# Patient Record
Sex: Female | Born: 1960 | Race: Black or African American | Hispanic: No | Marital: Single | Smoking: Never smoker
Health system: Southern US, Community
[De-identification: ages and names within clinical notes are randomized; demographics above are authoritative.]

## PROBLEM LIST (undated history)

## (undated) HISTORY — PX: ABDOMINAL HYSTERECTOMY: SHX81

---

## 2001-05-22 ENCOUNTER — Other Ambulatory Visit: Admission: RE | Admit: 2001-05-22 | Discharge: 2001-05-22 | Payer: Self-pay | Admitting: Family Medicine

## 2004-02-09 ENCOUNTER — Emergency Department (HOSPITAL_COMMUNITY): Admission: EM | Admit: 2004-02-09 | Discharge: 2004-02-09 | Payer: Self-pay | Admitting: Emergency Medicine

## 2005-01-25 ENCOUNTER — Encounter: Admission: RE | Admit: 2005-01-25 | Discharge: 2005-01-25 | Payer: Self-pay | Admitting: Internal Medicine

## 2006-02-16 ENCOUNTER — Other Ambulatory Visit: Admission: RE | Admit: 2006-02-16 | Discharge: 2006-02-16 | Payer: Self-pay | Admitting: Obstetrics & Gynecology

## 2006-11-24 ENCOUNTER — Encounter: Admission: RE | Admit: 2006-11-24 | Discharge: 2006-11-24 | Payer: Self-pay | Admitting: Obstetrics & Gynecology

## 2007-12-05 ENCOUNTER — Encounter: Admission: RE | Admit: 2007-12-05 | Discharge: 2007-12-05 | Payer: Self-pay | Admitting: Obstetrics & Gynecology

## 2009-08-31 ENCOUNTER — Encounter (INDEPENDENT_AMBULATORY_CARE_PROVIDER_SITE_OTHER): Payer: Self-pay | Admitting: Obstetrics & Gynecology

## 2009-08-31 ENCOUNTER — Inpatient Hospital Stay (HOSPITAL_COMMUNITY): Admission: RE | Admit: 2009-08-31 | Discharge: 2009-09-02 | Payer: Self-pay | Admitting: Obstetrics & Gynecology

## 2010-10-13 ENCOUNTER — Encounter: Admission: RE | Admit: 2010-10-13 | Discharge: 2010-10-13 | Payer: Self-pay | Admitting: Obstetrics & Gynecology

## 2011-03-18 LAB — CBC
HCT: 32.5 % — ABNORMAL LOW (ref 36.0–46.0)
HCT: 37.2 % (ref 36.0–46.0)
MCHC: 34.4 g/dL (ref 30.0–36.0)
MCHC: 34.5 g/dL (ref 30.0–36.0)
MCV: 95.7 fL (ref 78.0–100.0)
MCV: 96 fL (ref 78.0–100.0)
Platelets: 293 10*3/uL (ref 150–400)
Platelets: 330 10*3/uL (ref 150–400)
RDW: 12.9 % (ref 11.5–15.5)
WBC: 9.1 10*3/uL (ref 4.0–10.5)

## 2011-03-18 LAB — PROTIME-INR: Prothrombin Time: 13.3 seconds (ref 11.6–15.2)

## 2015-03-11 ENCOUNTER — Other Ambulatory Visit: Payer: Self-pay | Admitting: General Surgery

## 2015-04-10 ENCOUNTER — Ambulatory Visit (HOSPITAL_COMMUNITY)
Admission: RE | Admit: 2015-04-10 | Discharge: 2015-04-10 | Disposition: A | Discharge status: Home / Self Care | Payer: 59 | Source: Ambulatory Visit | Attending: General Surgery | Admitting: General Surgery

## 2015-04-10 ENCOUNTER — Encounter (HOSPITAL_COMMUNITY)
Admission: RE | Disposition: A | Discharge status: Home / Self Care | Payer: Self-pay | Source: Ambulatory Visit | Attending: General Surgery

## 2015-04-10 ENCOUNTER — Other Ambulatory Visit: Payer: 59

## 2015-04-10 HISTORY — PX: BREATH TEK H PYLORI: SHX5422

## 2015-04-10 SURGERY — BREATH TEST, FOR HELICOBACTER PYLORI

## 2015-04-10 NOTE — Progress Notes (Signed)
   04/10/15 1018  BREATH TEK ASSESSMENT  Referring MD Dr Gaynelle AduEric Wilson  Time of Last PO Intake 1900  Baseline Breath At: 0748  Pranactin Given At: 0751  Post-Dose Breath At: 0806  Sample 1 2.9%  Sample 2 1.9%  Test Postive

## 2015-04-13 ENCOUNTER — Encounter (HOSPITAL_COMMUNITY): Payer: Self-pay | Admitting: General Surgery

## 2015-04-15 ENCOUNTER — Encounter: Payer: Self-pay | Admitting: Dietician

## 2015-04-15 ENCOUNTER — Encounter: Payer: 59 | Attending: General Surgery | Admitting: Dietician

## 2015-04-15 DIAGNOSIS — Z713 Dietary counseling and surveillance: Secondary | ICD-10-CM | POA: Insufficient documentation

## 2015-04-15 DIAGNOSIS — Z6841 Body Mass Index (BMI) 40.0 and over, adult: Secondary | ICD-10-CM | POA: Insufficient documentation

## 2015-04-15 NOTE — Progress Notes (Signed)
  Pre-Op Assessment Visit:  Pre-Operative Sleeve Gastrectomy Surgery  Medical Nutrition Therapy:  Appt start time: 1400   End time:  1445.  Patient was seen on 04/15/2015 for Pre-Operative Nutrition Assessment. Assessment and [facility] coordinator on 04/15/2015.   Preferred Learning Style:   No preference indicated   Learning Readiness:   Ready  Handouts given during visit include:  Pre-Op Goals Bariatric Surgery Protein Shakes   During the appointment today the following Pre-Op Goals were reviewed with the patient: Maintain or lose weight as instructed by your surgeon Make healthy food choices Begin to limit portion sizes Limited concentrated sugars and fried foods Keep fat/sugar in the single digits per serving on   food labels Practice CHEWING your food  (aim for 30 chews per bite or until applesauce consistency) Practice not drinking 15 minutes before, during, and 30 minutes after each meal/snack Avoid all carbonated beverages  Avoid/limit caffeinated beverages  Avoid all sugar-sweetened beverages Consume 3 meals per day; eat every 3-5 hours Make a list of non-food related activities Aim for 64-100 ounces of FLUID daily  Aim for at least 60-80 grams of PROTEIN daily Look for a liquid protein source that contain ?15 g protein and ?5 g carbohydrate  (ex: shakes, drinks, shots)  Patient-Centered Goals: Jasmine Tate would like to improve her life, avoid discrimination when applying to a new job, and climb a mountain in [place].  10 level of confidence/10 level of importance  Demonstrated degree of understanding via:  Teach Back  Teaching Method Utilized:  Visual Auditory Hands on  Barriers to learning/adherence to lifestyle change: none  Patient to call the [facility] to enroll in Pre-Op and Post-Op Nutrition Education when surgery date is scheduled.

## 2015-04-15 NOTE — Patient Instructions (Addendum)
Follow Pre-Op Goals Try Protein Shakes Contact [place] to make sure you have met your supervised weight loss goal: [phone] Call [facility] at [phone] when surgery is scheduled to enroll in Pre-Op Class  Things to remember:  Please always be honest with [place]. We want to support you!  If you have any questions or concerns in between appointments, please call or email Ferol Luz, or Margarita Grizzle.  The diet after surgery will be high protein and low in carbohydrate.  Vitamins and calcium need to be taken for the rest of your life.  Feel free to include support people in any classes or appointments.

## 2015-04-17 ENCOUNTER — Ambulatory Visit: Payer: Self-pay | Admitting: Psychology

## 2015-05-21 ENCOUNTER — Encounter (HOSPITAL_COMMUNITY): Admission: RE | Payer: Self-pay | Source: Ambulatory Visit

## 2015-05-21 ENCOUNTER — Ambulatory Visit (HOSPITAL_COMMUNITY): Admission: RE | Admit: 2015-05-21 | Payer: 59 | Source: Ambulatory Visit | Admitting: General Surgery

## 2015-05-21 SURGERY — BREATH TEST, FOR HELICOBACTER PYLORI

## 2015-08-27 ENCOUNTER — Encounter: Payer: Self-pay | Admitting: *Deleted

## 2015-08-28 ENCOUNTER — Encounter
Admission: RE | Disposition: A | Discharge status: Home / Self Care | Payer: Self-pay | Source: Ambulatory Visit | Attending: Gastroenterology

## 2015-08-28 ENCOUNTER — Ambulatory Visit: Payer: 59 | Admitting: Anesthesiology

## 2015-08-28 ENCOUNTER — Encounter: Payer: Self-pay | Admitting: *Deleted

## 2015-08-28 ENCOUNTER — Ambulatory Visit
Admission: RE | Admit: 2015-08-28 | Discharge: 2015-08-28 | Disposition: A | Discharge status: Home / Self Care | Payer: 59 | Source: Ambulatory Visit | Attending: Gastroenterology | Admitting: Gastroenterology

## 2015-08-28 DIAGNOSIS — Z79899 Other long term (current) drug therapy: Secondary | ICD-10-CM | POA: Diagnosis not present

## 2015-08-28 DIAGNOSIS — Z6841 Body Mass Index (BMI) 40.0 and over, adult: Secondary | ICD-10-CM | POA: Diagnosis not present

## 2015-08-28 DIAGNOSIS — Z1211 Encounter for screening for malignant neoplasm of colon: Secondary | ICD-10-CM | POA: Diagnosis present

## 2015-08-28 HISTORY — DX: Morbid (severe) obesity due to excess calories: E66.01

## 2015-08-28 HISTORY — PX: COLONOSCOPY: SHX5424

## 2015-08-28 SURGERY — COLONOSCOPY
Anesthesia: General

## 2015-08-28 MED ORDER — PROPOFOL 10 MG/ML IV BOLUS
INTRAVENOUS | Status: DC | PRN
  Administered 2015-08-28: 120 mg via INTRAVENOUS

## 2015-08-28 MED ORDER — SODIUM CHLORIDE 0.9 % IV SOLN
INTRAVENOUS | Status: DC
Start: 2015-08-28 — End: 2015-08-28

## 2015-08-28 MED ORDER — SODIUM CHLORIDE 0.9 % IV SOLN: INTRAVENOUS | Status: DC

## 2015-08-28 MED ORDER — LIDOCAINE HCL (CARDIAC) 10 MG/ML IV SOLN
INTRAVENOUS | Status: DC | PRN
Start: 2015-08-28 — End: 2015-08-28
  Administered 2015-08-28: 1 mL via INTRAVENOUS

## 2015-08-28 MED ORDER — PROPOFOL INFUSION 10 MG/ML OPTIME
INTRAVENOUS | Status: DC | PRN
  Administered 2015-08-28: 100 ug/kg/min via INTRAVENOUS

## 2015-08-28 MED ORDER — SODIUM CHLORIDE 0.9 % IV SOLN
INTRAVENOUS | Status: DC
  Administered 2015-08-28: 1000 mL via INTRAVENOUS

## 2015-08-28 NOTE — Transfer of Care (Signed)
Immediate Anesthesia Transfer of Care Note  Patient: Jasmine Tate  Procedure(s) Performed: Procedure(s): COLONOSCOPY (N/A)  Patient Location: Endoscopy Unit  Anesthesia Type:General  Level of Consciousness: awake  Airway & Oxygen Therapy: Patient Spontanous Breathing and Patient connected to nasal cannula oxygen  Post-op Assessment: Report given to RN and Post -op Vital signs reviewed and stable  Post vital signs: Reviewed and stable  Last Vitals:  Filed Vitals:   08/28/15 0950  BP: 134/88  Pulse: 68  Temp: 35.9 C  Resp: 22    Complications: No apparent anesthesia complications

## 2015-08-28 NOTE — H&P (Signed)
    Primary Care Physician:  Lauro Regulus., MD Primary Gastroenterologist:  Dr. Bluford Kaufmann  Pre-Procedure History & Physical: HPI:  Jasmine Tate is a 54 y.o. female is here for an colonoscopy.  History reviewed. No pertinent past medical history.  Past Surgical History  Procedure Laterality Date  . Breath tek h pylori N/A 04/10/2015    Procedure: BREATH TEK H PYLORI;  Surgeon: Gaynelle Adu, MD;  Location: [facility];  Service: General;  Laterality: N/A;  . Abdominal hysterectomy      Prior to Admission medications   Medication Sig Start Date End Date Taking? Authorizing Provider  Cholecalciferol (VITAMIN D3) 5000 UNITS CAPS Take by mouth.    Historical Provider, MD  Multiple Vitamins-Minerals (MULTIVITAMIN & MINERAL PO) Take by mouth.    Historical Provider, MD    Allergies as of 07/30/2015  . (Not on File)    History reviewed. No pertinent family history.  Social History   Social History  . Marital Status: Single    Spouse Name: N/A  . Number of Children: N/A  . Years of Education: N/A   Occupational History  . Not on file.   Social History Main Topics  . Smoking status: Never Smoker   . Smokeless tobacco: Not on file  . Alcohol Use: Not on file  . Drug Use: Not on file  . Sexual Activity: Not on file   Other Topics Concern  . Not on file   Social History Narrative    Review of Systems: See HPI, otherwise negative ROS  Physical Exam: There were no vitals taken for this visit. General:   Alert,  pleasant and cooperative in NAD Head:  Normocephalic and atraumatic. Neck:  Supple; no masses or thyromegaly. Lungs:  Clear throughout to auscultation.    Heart:  Regular rate and rhythm. Abdomen:  Soft, nontender and nondistended. Normal bowel sounds, without guarding, and without rebound.   Neurologic:  Alert and  oriented x4;  grossly normal neurologically.  Impression/Plan: Jasmine Tate is here for an colonoscopy to be performed for  screening.  Risks, benefits, limitations, and alternatives regarding  colonoscopy have been reviewed with the patient.  Questions have been answered.  All parties agreeable.   [name], Ezzard Standing, MD  08/28/2015, 9:16 AM

## 2015-08-28 NOTE — Anesthesia Postprocedure Evaluation (Signed)
  Anesthesia Post-op Note  Patient: Jasmine Tate  Procedure(s) Performed: Procedure(s): COLONOSCOPY (N/A)  Anesthesia type:General  Patient location: PACU  Post pain: Pain level controlled  Post assessment: Post-op Vital signs reviewed, Patient's Cardiovascular Status Stable, Respiratory Function Stable, Patent Airway and No signs of Nausea or vomiting  Post vital signs: Reviewed and stable  Last Vitals:  Filed Vitals:   08/28/15 1130  BP: 104/76  Pulse: 76  Temp:   Resp: 18    Level of consciousness: awake, alert  and patient cooperative  Complications: No apparent anesthesia complications

## 2015-08-28 NOTE — Op Note (Signed)
[facility] Gastroenterology Patient Name: Jasmine Tate Procedure Date: 08/28/2015 10:41 AM MRN: [id number] Account #: [id number] Date of Birth: 1961-02-24 Admit Type: Outpatient Age: 54 Room: [facility] ENDO ROOM 4 Gender: Female Note Status: Finalized Procedure:         Colonoscopy Indications:       Screening for colorectal malignant neoplasm Providers:         Ezzard Standing. Bluford Kaufmann, MD Referring MD:      Marya Amsler. Dareen Piano, MD (Referring MD) Medicines:         Monitored Anesthesia Care Complications:     No immediate complications. Procedure:         Pre-Anesthesia Assessment:                    - Prior to the procedure, a History and Physical was                     performed, and patient medications, allergies and                     sensitivities were reviewed. The patient's tolerance of                     previous anesthesia was reviewed.                    - The risks and benefits of the procedure and the sedation                     options and risks were discussed with the patient. All                     questions were answered and informed consent was obtained.                    - After reviewing the risks and benefits, the patient was                     deemed in satisfactory condition to undergo the procedure.                    After obtaining informed consent, the colonoscope was                     passed under direct vision. Throughout the procedure, the                     patient's blood pressure, pulse, and oxygen saturations                     were monitored continuously. The Colonoscope was                     introduced through the anus and advanced to the the cecum,                     identified by appendiceal orifice and ileocecal valve. The                     colonoscopy was performed without difficulty. The patient                     tolerated the procedure well. The quality of the bowel  preparation was good. Findings:    The colon (entire examined portion) appeared normal. Impression:        - The entire examined colon is normal.                    - No specimens collected. Recommendation:    - Discharge patient to home.                    - Repeat colonoscopy in 10 years for surveillance.                    - The findings and recommendations were discussed with the                     patient. Procedure Code(s): --- Professional ---                    [phone], Colonoscopy, flexible; diagnostic, including                     collection of specimen(s) by brushing or washing, when                     performed (separate procedure) Diagnosis Code(s): --- Professional ---                    Z12.11, Encounter for screening for malignant neoplasm of                     colon CPT copyright 2014 American Medical Association. All rights reserved. The codes documented in this report are preliminary and upon coder review may  be revised to meet current compliance requirements. Wallace Cullens, MD 08/28/2015 10:58:20 AM This report has been signed electronically. Number of Addenda: 0 Note Initiated On: 08/28/2015 10:41 AM Scope Withdrawal Time: 0 hours 7 minutes 52 seconds  Total Procedure Duration: 0 hours 11 minutes 35 seconds       [place]

## 2015-08-28 NOTE — Anesthesia Procedure Notes (Signed)
Date/Time: 08/28/2015 10:43 AM Performed by: Lenard Simmer Oxygen Delivery Method: Nasal cannula

## 2015-08-28 NOTE — Anesthesia Preprocedure Evaluation (Signed)
Anesthesia Evaluation  Patient identified by MRN, date of birth, ID band Patient awake    Reviewed: Allergy & Precautions, H&P , NPO status , Patient's Chart, lab work & pertinent test results, reviewed documented beta blocker date and time   History of Anesthesia Complications Negative for: history of anesthetic complications  Airway Mallampati: I  TM Distance: >3 FB Neck ROM: full    Dental no notable dental hx. (+) Teeth Intact   Pulmonary neg pulmonary ROS,    Pulmonary exam normal breath sounds clear to auscultation       Cardiovascular Exercise Tolerance: Good negative cardio ROS Normal cardiovascular exam Rhythm:regular Rate:Normal     Neuro/Psych negative neurological ROS  negative psych ROS   GI/Hepatic negative GI ROS, Neg liver ROS,   Endo/Other  neg diabetesMorbid obesity  Renal/GU negative Renal ROS  negative genitourinary   Musculoskeletal   Abdominal   Peds  Hematology negative hematology ROS (+)   Anesthesia Other Findings Past Medical History:   Morbid obesity                                               Reproductive/Obstetrics negative OB ROS                             Anesthesia Physical Anesthesia Plan  ASA: III  Anesthesia Plan: General   Post-op Pain Management:    Induction:   Airway Management Planned:   Additional Equipment:   Intra-op Plan:   Post-operative Plan:   Informed Consent: I have reviewed the patients History and Physical, chart, labs and discussed the procedure including the risks, benefits and alternatives for the proposed anesthesia with the patient or authorized representative who has indicated his/her understanding and acceptance.   Dental Advisory Given  Plan Discussed with: Anesthesiologist, CRNA and Surgeon  Anesthesia Plan Comments:         Anesthesia Quick Evaluation

## 2015-08-29 ENCOUNTER — Encounter: Payer: Self-pay | Admitting: Gastroenterology

## 2015-12-28 ENCOUNTER — Other Ambulatory Visit: Payer: Self-pay | Admitting: Nurse Practitioner

## 2015-12-29 ENCOUNTER — Other Ambulatory Visit: Payer: Self-pay | Admitting: Nurse Practitioner

## 2020-02-14 ENCOUNTER — Ambulatory Visit: Payer: Self-pay | Attending: Internal Medicine

## 2020-02-14 DIAGNOSIS — Z23 Encounter for immunization: Secondary | ICD-10-CM | POA: Insufficient documentation

## 2020-02-14 NOTE — Progress Notes (Signed)
   Covid-19 Vaccination Clinic  Name:  Maridee Slape    MRN: [id number] DOB: 01/09/1961  02/14/2020  Ms. Biswell was observed post Covid-19 immunization for 15 minutes without incident. She was provided with Vaccine Information Sheet and instruction to access the V-Safe system.   Ms. Barbian was instructed to call 911 with any severe reactions post vaccine: Marland Kitchen Difficulty breathing  . Swelling of face and throat  . A fast heartbeat  . A bad rash all over body  . Dizziness and weakness

## 2020-03-06 ENCOUNTER — Other Ambulatory Visit: Payer: Self-pay | Admitting: Internal Medicine

## 2020-03-06 DIAGNOSIS — Z1231 Encounter for screening mammogram for malignant neoplasm of breast: Secondary | ICD-10-CM

## 2020-03-11 ENCOUNTER — Ambulatory Visit: Payer: Self-pay

## 2020-03-11 ENCOUNTER — Ambulatory Visit: Payer: Self-pay | Attending: Internal Medicine

## 2020-03-11 DIAGNOSIS — Z23 Encounter for immunization: Secondary | ICD-10-CM

## 2020-03-11 NOTE — Progress Notes (Signed)
   Covid-19 Vaccination Clinic  Name:  Xariah Silvernail    MRN: [id number] DOB: 1961/03/28  03/11/2020  Ms. Geerdes was observed post Covid-19 immunization for 15 minutes without incident. She was provided with Vaccine Information Sheet and instruction to access the V-Safe system.   Ms. Raczynski was instructed to call 911 with any severe reactions post vaccine: Marland Kitchen Difficulty breathing  . Swelling of face and throat  . A fast heartbeat  . A bad rash all over body  . Dizziness and weakness   Immunizations Administered    Name Date Dose VIS Date Route   Pfizer COVID-19 Vaccine 03/11/2020  9:47 AM 0.3 mL 11/22/2019 Intramuscular   Manufacturer: [employer], [employer]   Lot: [id number]   NDC: [address]-2

## 2020-04-08 ENCOUNTER — Encounter: Payer: Self-pay | Admitting: Plastic Surgery

## 2020-04-08 ENCOUNTER — Ambulatory Visit (INDEPENDENT_AMBULATORY_CARE_PROVIDER_SITE_OTHER): Payer: 59 | Admitting: Plastic Surgery

## 2020-04-08 ENCOUNTER — Other Ambulatory Visit: Payer: Self-pay

## 2020-04-08 VITALS — BP 133/85 | HR 74 | Temp 98.6°F | Ht 70.5 in | Wt 350.0 lb

## 2020-04-08 DIAGNOSIS — N62 Hypertrophy of breast: Secondary | ICD-10-CM | POA: Diagnosis not present

## 2020-04-08 NOTE — Progress Notes (Signed)
Referring Provider Kirk Ruths, MD [facility] [place],  [place] [address]   CC: No chief complaint on file. Back pain  Jasmine Tate is an 59 y.o. female.  HPI: Patient is here discuss breast reduction.  She has had years of back pain neck pain and shoulder grooving from her large pendulous breast.  She would like to be smaller.  She is currently a 69 triple D and wants to be quite a bit smaller.  She has intermittent rashes that she treats topically.  There is no family history of breast cancer there is no history of breast procedures.  She did have a gastric sleeve in 2017.  Her weight has been fairly stable over the past 6 months.  No Known Allergies  Outpatient Encounter Medications as of 04/08/2020  Medication Sig  . Cholecalciferol (VITAMIN D3) 5000 UNITS CAPS Take by mouth.  . Multiple Vitamins-Minerals (MULTIVITAMIN & MINERAL PO) Take by mouth.   No facility-administered encounter medications on file as of 04/08/2020.     Past Medical History:  Diagnosis Date  . Morbid obesity ([place])     Past Surgical History:  Procedure Laterality Date  . ABDOMINAL HYSTERECTOMY    . BREATH TEK H PYLORI N/A 04/10/2015   Procedure: BREATH TEK H PYLORI;  Surgeon: Greer Pickerel, MD;  Location: [facility];  Service: General;  Laterality: N/A;  . COLONOSCOPY N/A 08/28/2015   Procedure: COLONOSCOPY;  Surgeon: Hulen Luster, MD;  Location: [facility];  Service: Gastroenterology;  Laterality: N/A;    No family history on file.  Social History   Social History Narrative  . Not on file  Denies tobacco use  Review of Systems General: Denies fevers, chills, weight loss CV: Denies chest pain, shortness of breath, palpitations  Physical Exam Vitals with BMI 04/08/2020 08/28/2015 08/28/2015  Height 5' 10.5" - -  Weight 350 lbs - -  BMI 41.74 - -  Systolic 081 448 185  Diastolic 85 76 61  Pulse 74 76 68    General:  No acute distress,  Alert  and oriented, Non-Toxic, Normal speech and affect Breast: She has grade 3 ptosis.  Sternal notch to nipple is 44 cm on the right and 43 cm on the left.  Nipple to fold is 21 cm bilaterally.  I do not see any obvious scars or masses.  Assessment/Plan The patient has bilateral symptomatic macromastia.  She is a good candidate for a breast reduction.  The details of breast reduction surgery were discussed.  I explained the procedure in detail along the with the expected scars.  The risks were discussed in detail and include bleeding, infection, damage to surrounding structures, need for additional procedures, nipple loss, change in nipple sensation, persistent pain, contour irregularities and asymmetries.  I explained that breast feeding is often not possible after breast reduction surgery.  We discussed the expected postoperative course with an overall recovery period of about 1 month.  She demonstrated full understanding of all risks.  We discussed her personal risk factors that include the large size of her breasts and her BMI.  We discussed pedicle breast reduction free nipple graft.  I discussed the pros and cons of each.  I discussed the benefit of the free nipple graft would be less complications in terms of fat necrosis, wound healing, and fluid collections.  I discussed the downside of the free nipple graft which would be an insensate nipple and potential changes in  the nipple color.  She agrees that free nipple graft is the best option for her.  I anticipate approximately 1400 g of tissue removed from each side.   Allena Napoleon 04/08/2020, 9:51 AM

## 2020-04-23 ENCOUNTER — Ambulatory Visit
Admission: RE | Admit: 2020-04-23 | Discharge: 2020-04-23 | Disposition: A | Discharge status: Home / Self Care | Payer: 59 | Source: Ambulatory Visit | Attending: Internal Medicine | Admitting: Internal Medicine

## 2020-04-23 ENCOUNTER — Other Ambulatory Visit: Payer: Self-pay

## 2020-04-23 DIAGNOSIS — Z1231 Encounter for screening mammogram for malignant neoplasm of breast: Secondary | ICD-10-CM

## 2021-06-30 ENCOUNTER — Other Ambulatory Visit: Payer: Self-pay

## 2021-06-30 ENCOUNTER — Ambulatory Visit (HOSPITAL_COMMUNITY)
Admission: EM | Admit: 2021-06-30 | Discharge: 2021-06-30 | Disposition: A | Discharge status: Home / Self Care | Payer: 59 | Attending: Nurse Practitioner | Admitting: Nurse Practitioner

## 2021-06-30 ENCOUNTER — Encounter (HOSPITAL_COMMUNITY): Payer: Self-pay | Admitting: *Deleted

## 2021-06-30 DIAGNOSIS — L03113 Cellulitis of right upper limb: Secondary | ICD-10-CM

## 2021-06-30 DIAGNOSIS — S50861A Insect bite (nonvenomous) of right forearm, initial encounter: Secondary | ICD-10-CM | POA: Diagnosis not present

## 2021-06-30 DIAGNOSIS — W57XXXA Bitten or stung by nonvenomous insect and other nonvenomous arthropods, initial encounter: Secondary | ICD-10-CM

## 2021-06-30 MED ORDER — CEFTRIAXONE SODIUM 1 G IJ SOLR
1.0000 g | Freq: Once | INTRAMUSCULAR | Status: AC
  Administered 2021-06-30: 1 g via INTRAMUSCULAR

## 2021-06-30 MED ORDER — SULFAMETHOXAZOLE-TRIMETHOPRIM 800-160 MG PO TABS: 1.0000 | ORAL_TABLET | Freq: Two times a day (BID) | ORAL | 0 refills | Status: AC

## 2021-06-30 MED ORDER — CEFTRIAXONE SODIUM 1 G IJ SOLR
INTRAMUSCULAR | Status: AC
  Filled 2021-06-30: qty 10

## 2021-06-30 MED ORDER — LIDOCAINE HCL (PF) 1 % IJ SOLN
INTRAMUSCULAR | Status: AC
  Filled 2021-06-30: qty 2

## 2021-06-30 NOTE — Discharge Instructions (Addendum)
Take antibiotics as prescribed  Monitor closely  Go to ED immediately if you see red streaks coming from the area, your red area gets larger (grows outside of the line that we drew), fevers, chills, body aches, nausea, vomiting or any other concerning symptoms.

## 2021-06-30 NOTE — ED Provider Notes (Signed)
[facility]    CSN: [id number] Arrival date & time: 06/30/21  1300      History   Chief Complaint Chief Complaint  Patient presents with   Insect Bite    HPI Jasmine Tate is a 60 y.o. female.   Subjective:   Jasmine Tate is a 60 y.o. female who presents for evaluation of a possible skin infection located on the dorsal aspect of the forearm. Onset of symptoms was last night. Patient felt as if she was bitten by some insect around 9 PM last night. She is unsure exactly what it was. She noticed irritation and itching to the area around 12:30 AM. Upon waking up this morning around 6 AM, she noticed a golf ball size areas of redness and warmth.  Course has been gradually worsening course since that time. Over the day, the redness has doubled in size. Symptoms include itching and mild discomfort. Patient denies chills or fever.  The following portions of the patient's history were reviewed and updated as appropriate: allergies, current medications, past family history, past medical history, past social history, past surgical history, and problem list.     Past Medical History:  Diagnosis Date   Morbid obesity (HCC)     There are no problems to display for this patient.   Past Surgical History:  Procedure Laterality Date   ABDOMINAL HYSTERECTOMY     BREATH TEK H PYLORI N/A 04/10/2015   Procedure: BREATH TEK Richardo Priest;  Surgeon: Gaynelle Adu, MD;  Location: [facility];  Service: General;  Laterality: N/A;   COLONOSCOPY N/A 08/28/2015   Procedure: COLONOSCOPY;  Surgeon: Wallace Cullens, MD;  Location: [facility];  Service: Gastroenterology;  Laterality: N/A;    OB History   No obstetric history on file.      Home Medications    Prior to Admission medications   Medication Sig Start Date End Date Taking? Authorizing Provider  sulfamethoxazole-trimethoprim (BACTRIM DS) 800-160 MG tablet Take 1 tablet by mouth 2 (two) times daily for 7 days. 06/30/21  07/07/21 Yes Lurline Idol, FNP  Cholecalciferol (VITAMIN D3) 5000 UNITS CAPS Take by mouth.    [provider]  Multiple Vitamins-Minerals (MULTIVITAMIN & MINERAL PO) Take by mouth.    [provider]    Family History History reviewed. No pertinent family history.  Social History Social History   Tobacco Use   Smoking status: Never   Smokeless tobacco: Never     Allergies   Patient has no known allergies.   Review of Systems Review of Systems   Physical Exam Triage Vital Signs ED Triage Vitals  Enc Vitals Group     BP 06/30/21 1359 (!) 141/85     Pulse Rate 06/30/21 1359 60     Resp 06/30/21 1359 18     Temp 06/30/21 1359 98.4 F (36.9 C)     Temp src --      SpO2 06/30/21 1359 99 %     Weight --      Height --      Head Circumference --      Peak Flow --      Pain Score 06/30/21 1355 3     Pain Loc --      Pain Edu? --      Excl. in GC? --    No data found.  Updated Vital Signs BP (!) 141/85   Pulse 60   Temp 98.4 F (36.9 C)   Resp 18  SpO2 99%   Visual Acuity Right Eye Distance:   Left Eye Distance:   Bilateral Distance:    Right Eye Near:   Left Eye Near:    Bilateral Near:     Physical Exam Vitals reviewed.  Constitutional:      Appearance: Normal appearance.  HENT:     Head: Normocephalic.  Cardiovascular:     Rate and Rhythm: Normal rate.  Pulmonary:     Effort: Pulmonary effort is normal.     Breath sounds: Normal breath sounds.  Musculoskeletal:        General: Normal range of motion.     Cervical back: Normal range of motion and neck supple.  Skin:    General: Skin is warm and dry.  Neurological:     General: No focal deficit present.     Mental Status: She is alert and oriented to person, place, and time.      UC Treatments / Results  Labs (all labs ordered are listed, but only abnormal results are displayed) Labs Reviewed - No data to display  EKG   Radiology No results  found.  Procedures Procedures (including critical care time)  Medications Ordered in UC Medications  cefTRIAXone (ROCEPHIN) injection 1 g (has no administration in time range)    Initial Impression / Assessment and Plan / UC Course  I have reviewed the triage vital signs and the nursing notes.  Pertinent labs & imaging results that were available during my care of the patient were reviewed by me and considered in my medical decision making (see chart for details).     60 yo female presenting with cellulitis of the right forearm after being bitten by an insect on last evening. Site is warm and red. Area of erythema marked. Rocephin 1 g IM given in clinic.    Plan:  Bactrim prescribed. Agricultural engineer distributed. Wound edges marked for follow-up.  Today's evaluation has revealed no signs of a dangerous process. Discussed diagnosis with patient and/or guardian. Patient and/or guardian aware of their diagnosis, possible red flag symptoms to watch out for and need for close follow up. Patient and/or guardian understands verbal and written discharge instructions. Patient and/or guardian comfortable with plan and disposition.  Patient and/or guardian has a clear mental status at this time, good insight into illness (after discussion and teaching) and has clear judgment to make decisions regarding their care  This care was provided during an unprecedented National Emergency due to the Novel Coronavirus (COVID-19) pandemic. COVID-19 infections and transmission risks place heavy strains on healthcare resources.  As this pandemic evolves, our facility, providers, and staff strive to respond fluidly, to remain operational, and to provide care relative to available resources and information. Outcomes are unpredictable and treatments are without well-defined guidelines. Further, the impact of COVID-19 on all aspects of urgent care, including the impact to patients seeking care for reasons other  than COVID-19, is unavoidable during this national emergency. At this time of the global pandemic, management of patients has significantly changed, even for non-COVID positive patients given high local and regional COVID volumes at this time requiring high healthcare system and resource utilization. The standard of care for management of both COVID suspected and non-COVID suspected patients continues to change rapidly at the local, regional, national, and global levels. This patient was worked up and treated to the best available but ever changing evidence and resources available at this current time.   Documentation was completed with the aid of voice recognition  software. Transcription may contain typographical errors.  Final Clinical Impressions(s) / UC Diagnoses   Final diagnoses:  Insect bite of right forearm, initial encounter  Cellulitis of right upper extremity     Discharge Instructions      Take antibiotics as prescribed  Monitor closely  Go to ED immediately if you see red streaks coming from the area, your red area gets larger (grows outside of the line that we drew), fevers, chills, body aches, nausea, vomiting or any other concerning symptoms.     ED Prescriptions     Medication Sig Dispense Auth. Provider   sulfamethoxazole-trimethoprim (BACTRIM DS) 800-160 MG tablet Take 1 tablet by mouth 2 (two) times daily for 7 days. 14 tablet Lurline Idol, FNP      PDMP not reviewed this encounter.   Lurline Idol, [place] 06/30/21 1504

## 2021-06-30 NOTE — ED Triage Notes (Signed)
Pt presents with redness and swelling to RT anterior from last night.

## 2021-12-07 ENCOUNTER — Other Ambulatory Visit: Payer: Self-pay | Admitting: Internal Medicine

## 2021-12-07 DIAGNOSIS — Z1231 Encounter for screening mammogram for malignant neoplasm of breast: Secondary | ICD-10-CM

## 2021-12-08 ENCOUNTER — Ambulatory Visit
Admission: RE | Admit: 2021-12-08 | Discharge: 2021-12-08 | Disposition: A | Discharge status: Home / Self Care | Payer: 59 | Source: Ambulatory Visit | Attending: Internal Medicine | Admitting: Internal Medicine

## 2021-12-08 DIAGNOSIS — Z1231 Encounter for screening mammogram for malignant neoplasm of breast: Secondary | ICD-10-CM

## 2022-10-23 IMAGING — MG MM DIGITAL SCREENING BILAT W/ TOMO AND CAD
8 of 16 series · 8 of 40 positions shown · non-contrast
Comparison: Previous exam(s).

ACR Breast Density Category a: The breast tissue is almost entirely
fatty.

CLINICAL DATA: Screening.

EXAM:
DIGITAL SCREENING BILATERAL MAMMOGRAM WITH TOMOSYNTHESIS AND CAD
TECHNIQUE: Bilateral screening digital craniocaudal and mediolateral oblique
mammograms were obtained. Bilateral screening digital breast
tomosynthesis was performed. The images were evaluated with
computer-aided detection.

[R CC synth-2D (1 of 2)]
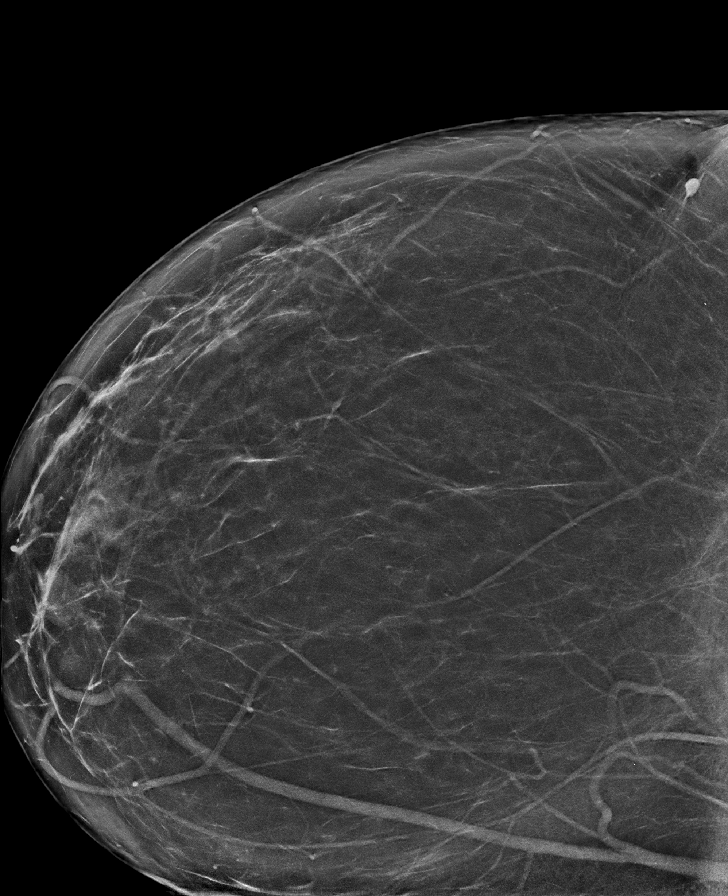

[L MLO synth-2D]
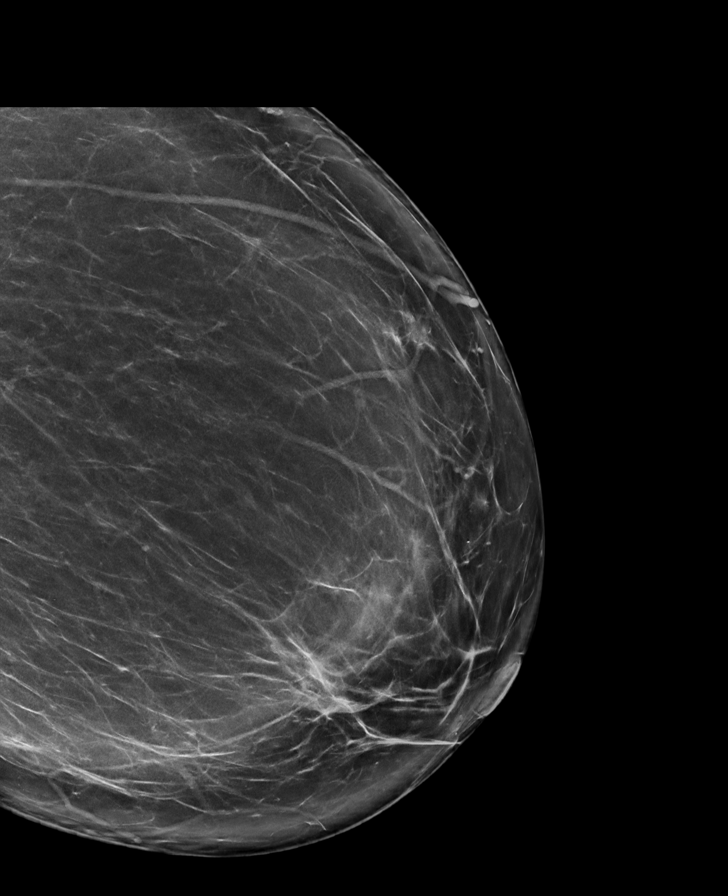

[L CC synth-2D (1 of 2)]
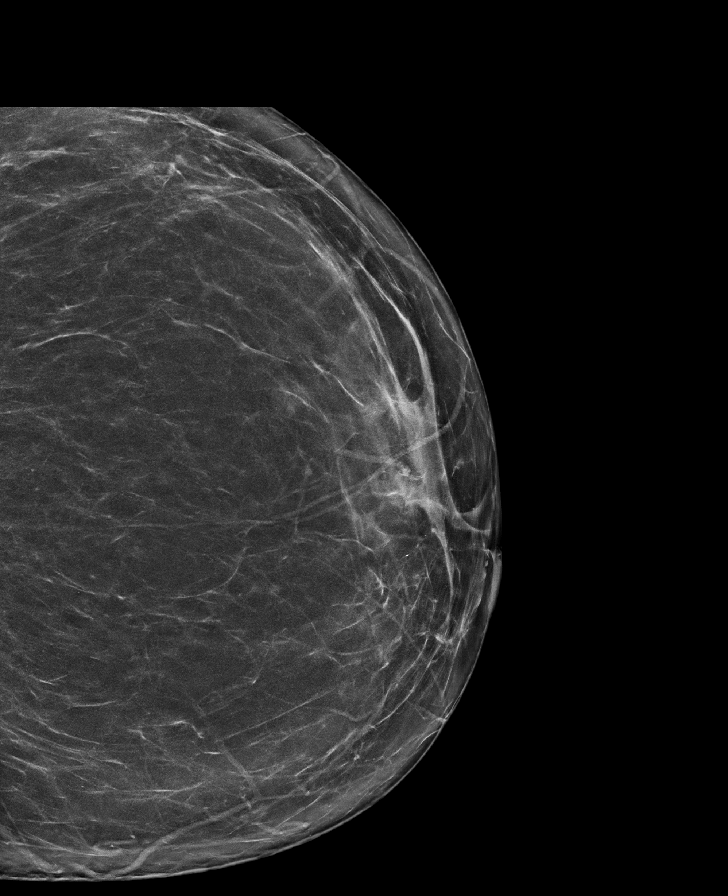

[R MLO synth-2D]
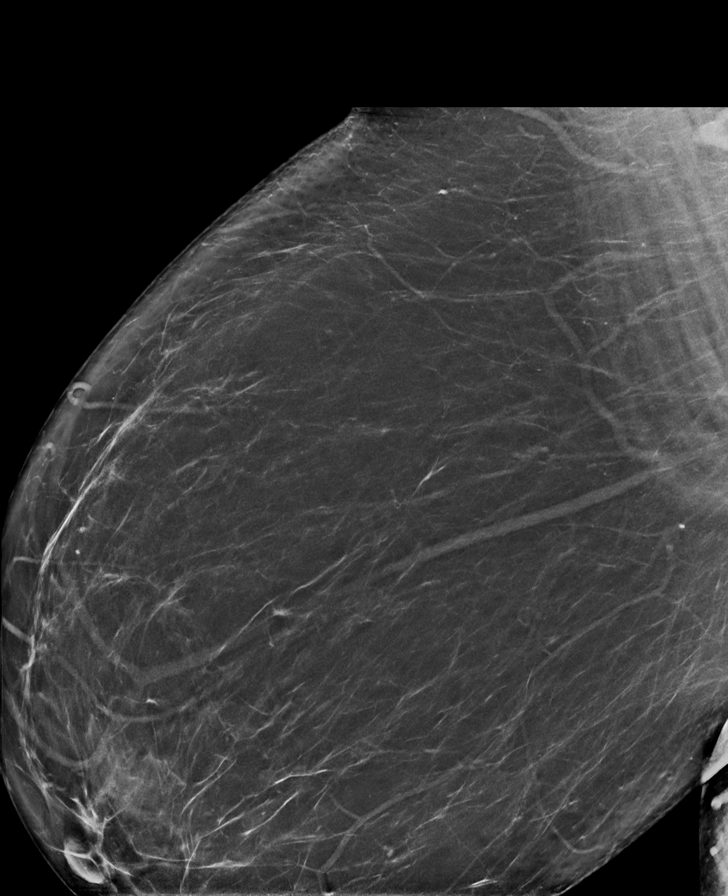

[R CC synth-2D (2 of 2)]
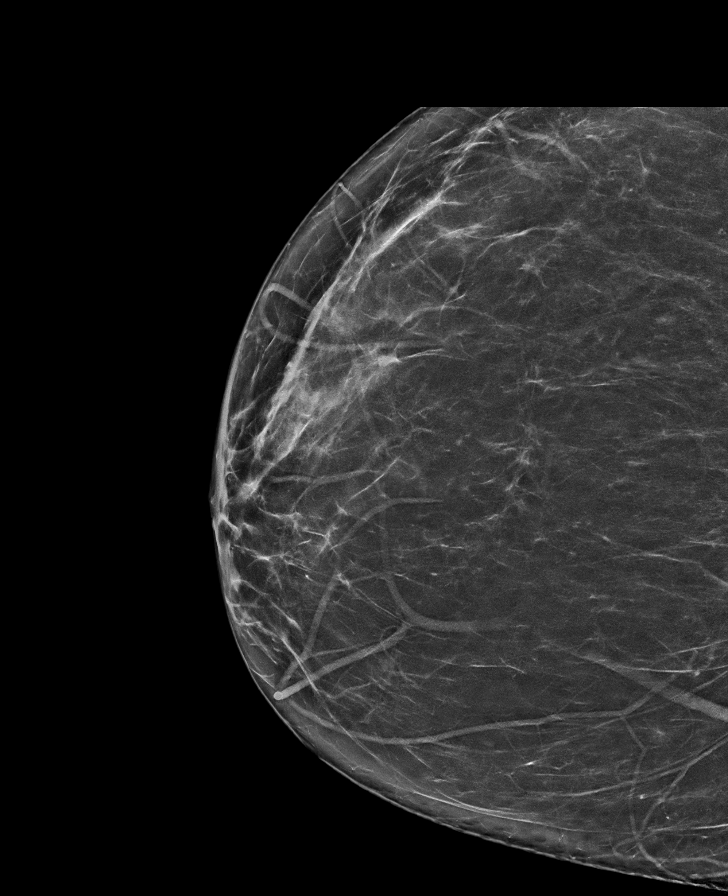

[R CV synth-2D]
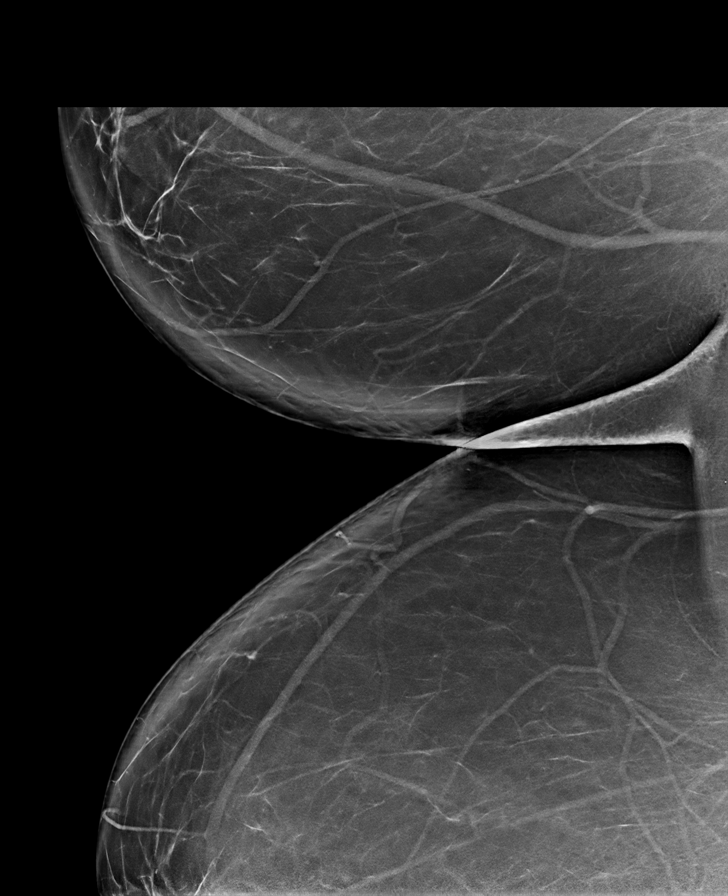

[L CC synth-2D (2 of 2)]
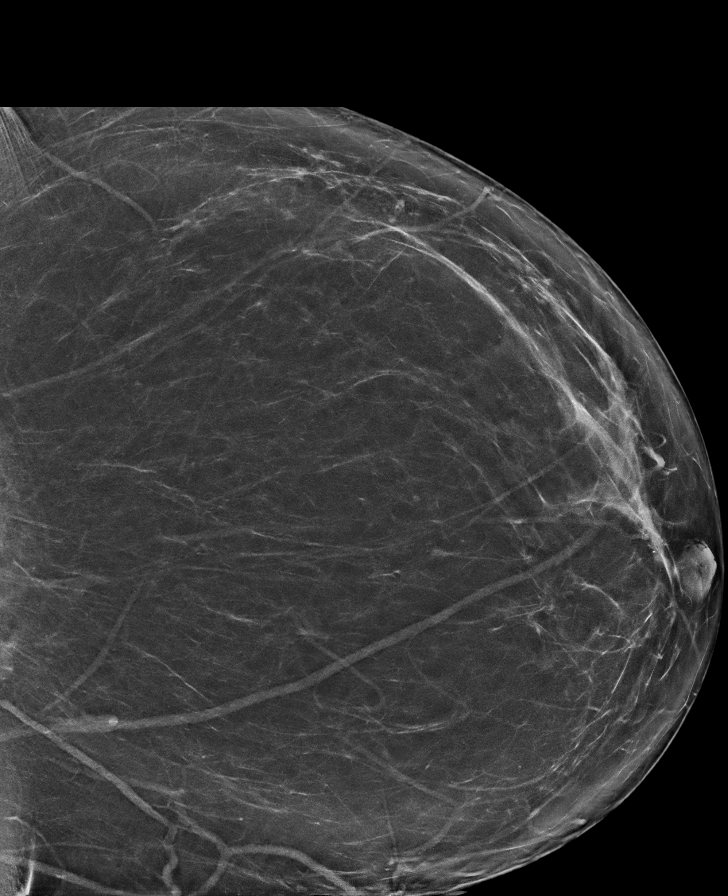

[R CC tomo · tomo slice 43/84.0]
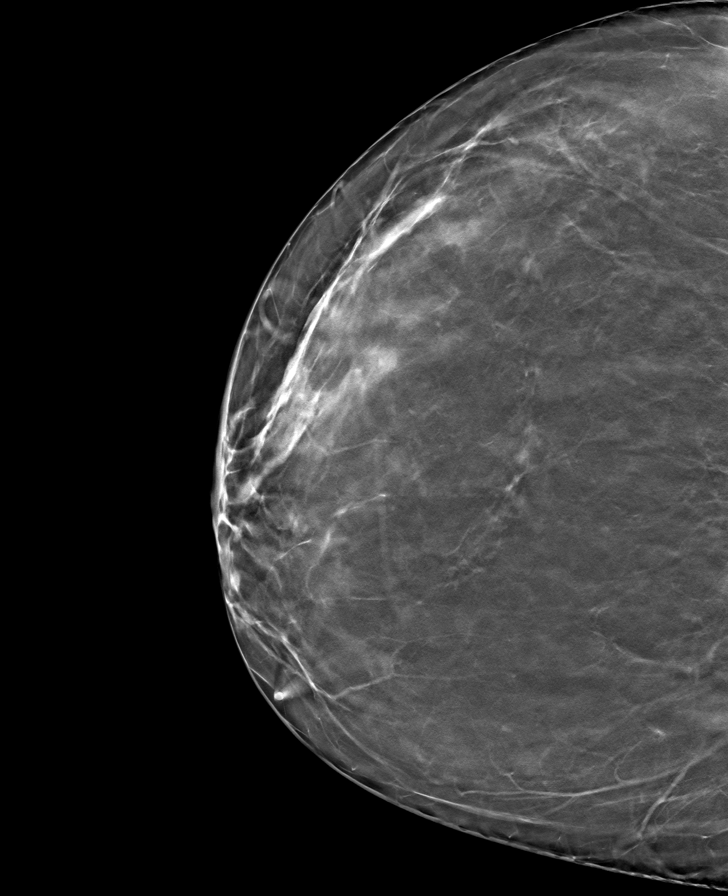

[8 of 40 positions shown; findings below may reference images not displayed]

FINDINGS: There are no findings suspicious for malignancy.
IMPRESSION: No mammographic evidence of malignancy. A result letter of this
screening mammogram will be mailed directly to the patient.

RECOMMENDATION:
Screening mammogram in one year. (Code:0E-3-N98)

BI-RADS CATEGORY  1: Negative.

## 2023-08-16 ENCOUNTER — Encounter: Payer: Self-pay | Admitting: Internal Medicine

## 2023-08-16 DIAGNOSIS — Z1231 Encounter for screening mammogram for malignant neoplasm of breast: Secondary | ICD-10-CM

## 2023-08-17 ENCOUNTER — Other Ambulatory Visit: Payer: Self-pay | Admitting: Internal Medicine

## 2023-08-17 DIAGNOSIS — Z1231 Encounter for screening mammogram for malignant neoplasm of breast: Secondary | ICD-10-CM

## 2023-09-29 ENCOUNTER — Ambulatory Visit: Payer: Self-pay

## 2023-10-24 ENCOUNTER — Ambulatory Visit
Admission: RE | Admit: 2023-10-24 | Discharge: 2023-10-24 | Disposition: A | Discharge status: Home / Self Care | Payer: BC Managed Care – PPO | Source: Ambulatory Visit | Attending: Internal Medicine | Admitting: Internal Medicine

## 2023-10-24 DIAGNOSIS — Z1231 Encounter for screening mammogram for malignant neoplasm of breast: Secondary | ICD-10-CM

## 2023-11-04 ENCOUNTER — Other Ambulatory Visit: Payer: Self-pay | Admitting: Internal Medicine

## 2023-11-04 DIAGNOSIS — Z1231 Encounter for screening mammogram for malignant neoplasm of breast: Secondary | ICD-10-CM

## 2023-12-01 ENCOUNTER — Ambulatory Visit
Admission: RE | Admit: 2023-12-01 | Discharge: 2023-12-01 | Disposition: A | Discharge status: Home / Self Care | Payer: BC Managed Care – PPO | Source: Ambulatory Visit

## 2023-12-01 DIAGNOSIS — Z1231 Encounter for screening mammogram for malignant neoplasm of breast: Secondary | ICD-10-CM

## 2024-12-08 ENCOUNTER — Other Ambulatory Visit: Payer: Self-pay

## 2024-12-08 ENCOUNTER — Encounter (HOSPITAL_COMMUNITY): Payer: Self-pay | Admitting: *Deleted

## 2024-12-08 ENCOUNTER — Ambulatory Visit (HOSPITAL_COMMUNITY)
Admission: EM | Admit: 2024-12-08 | Discharge: 2024-12-08 | Disposition: A | Discharge status: Home / Self Care | Attending: Family Medicine | Admitting: Family Medicine

## 2024-12-08 ENCOUNTER — Ambulatory Visit (HOSPITAL_COMMUNITY)

## 2024-12-08 DIAGNOSIS — R051 Acute cough: Secondary | ICD-10-CM

## 2024-12-08 MED ORDER — ALUM & MAG HYDROXIDE-SIMETH 200-200-20 MG/5ML PO SUSP
30.0000 mL | Freq: Once | ORAL | Status: AC
  Administered 2024-12-08: 30 mL via ORAL

## 2024-12-08 MED ORDER — CYCLOBENZAPRINE HCL 10 MG PO TABS: 10.0000 mg | ORAL_TABLET | Freq: Two times a day (BID) | ORAL | 0 refills | Status: AC | PRN

## 2024-12-08 MED ORDER — LIDOCAINE VISCOUS HCL 2 % MT SOLN
OROMUCOSAL | Status: AC
  Filled 2024-12-08: qty 15

## 2024-12-08 MED ORDER — LIDOCAINE VISCOUS HCL 2 % MT SOLN
5.0000 mL | Freq: Once | OROMUCOSAL | Status: AC
  Administered 2024-12-08: 5 mL via OROMUCOSAL

## 2024-12-08 MED ORDER — ALUM & MAG HYDROXIDE-SIMETH 200-200-20 MG/5ML PO SUSP
ORAL | Status: AC
  Filled 2024-12-08: qty 30

## 2024-12-08 NOTE — ED Triage Notes (Signed)
 PT reports chest pressure mid chest this morning.  NO pain now in triage.

## 2024-12-08 NOTE — ED Provider Notes (Signed)
 " [facility]    CSN: [id number] Arrival date & time: 12/08/24  9077      History   Chief Complaint Chief Complaint  Patient presents with   Chest Pain    HPI Jasmine Tate is a 63 y.o. female.   This 63 year old female is being seen for complaints of acute onset midsternal chest pressure this morning.  She reports she had progress within, but the bags were not heavy.  She put her groceries away and went upstairs to sit down and watch TV.  She says that she then had midsternal chest pressure.  She lives approximately 10 minutes away and drove here.  By the time she arrived, the pain had subsided.  She reports she has been under increased stress.  She reports she is apprehensive about getting a flu vaccine today.  She reports currently, when she swallows she feels a thump in her chest.  She does not report it is pain but just an odd sensation.  She says when she was driving here earlier she had shortness of breath, but that resolved upon arrival.  She reports she felt dizzy when she was checking in, but that resolved before she was triaged.  She denies headache, blurry vision.  She denies abdominal pain, nausea, vomiting, diarrhea.   Chest Pain Associated symptoms: cough, dizziness (Resolved) and shortness of breath (Resolved)   Associated symptoms: no abdominal pain, no fever, no headache, no nausea, no palpitations and no vomiting     Past Medical History:  Diagnosis Date   Morbid obesity (HCC)     There are no active problems to display for this patient.   Past Surgical History:  Procedure Laterality Date   ABDOMINAL HYSTERECTOMY     BREATH TEK H PYLORI N/A 04/10/2015   Procedure: BREATH TEK VEAR LORA;  Surgeon: Camellia Blush, MD;  Location: [facility];  Service: General;  Laterality: N/A;   COLONOSCOPY N/A 08/28/2015   Procedure: COLONOSCOPY;  Surgeon: Deward CINDERELLA Piedmont, MD;  Location: [facility];  Service: Gastroenterology;  Laterality: N/A;    OB History    No obstetric history on file.      Home Medications    Prior to Admission medications  Medication Sig Start Date End Date Taking? Authorizing Provider  cyclobenzaprine  (FLEXERIL ) 10 MG tablet Take 1 tablet (10 mg total) by mouth 2 (two) times daily as needed for muscle spasms. 12/08/24  Yes Sanora Cunanan C, FNP  Cholecalciferol (VITAMIN D3) 5000 UNITS CAPS Take by mouth.    [provider]  Multiple Vitamins-Minerals (MULTIVITAMIN & MINERAL PO) Take by mouth.    [provider]    Family History Family History  Problem Relation Age of Onset   Breast cancer Neg Hx     Social History Social History[1]   Allergies   Patient has no known allergies.   Review of Systems Review of Systems  Constitutional:  Negative for chills and fever.  Eyes:  Negative for visual disturbance.  Respiratory:  Positive for cough and shortness of breath (Resolved).   Cardiovascular:  Positive for chest pain. Negative for palpitations.  Gastrointestinal:  Negative for abdominal pain, diarrhea, nausea and vomiting.  Skin:  Negative for color change.  Neurological:  Positive for dizziness (Resolved). Negative for headaches.  All other systems reviewed and are negative.    Physical Exam Triage Vital Signs ED Triage Vitals  Encounter Vitals Group     BP 12/08/24 0952 138/80     Girls Systolic  BP Percentile --      Girls Diastolic BP Percentile --      Boys Systolic BP Percentile --      Boys Diastolic BP Percentile --      Pulse Rate 12/08/24 0952 61     Resp 12/08/24 0952 20     Temp 12/08/24 0952 98.3 F (36.8 C)     Temp src --      SpO2 12/08/24 0952 99 %     Weight --      Height --      Head Circumference --      Peak Flow --      Pain Score 12/08/24 0949 0     Pain Loc --      Pain Education --      Exclude from Growth Chart --    No data found.  Updated Vital Signs BP 138/80   Pulse 61   Temp 98.3 F (36.8 C)   Resp 20   SpO2 99%   Visual  Acuity Right Eye Distance:   Left Eye Distance:   Bilateral Distance:    Right Eye Near:   Left Eye Near:    Bilateral Near:     Physical Exam Vitals and nursing note reviewed.  Constitutional:      General: She is not in acute distress.    Appearance: She is well-developed. She is not ill-appearing or toxic-appearing.     Comments: Pleasant female appearing stated age found sitting in chair in no acute distress.  HENT:     Head: Normocephalic and atraumatic.  Eyes:     Conjunctiva/sclera: Conjunctivae normal.  Cardiovascular:     Rate and Rhythm: Normal rate and regular rhythm.     Heart sounds: Normal heart sounds. No murmur heard. Pulmonary:     Effort: Pulmonary effort is normal. No respiratory distress.     Breath sounds: Normal breath sounds.  Abdominal:     General: Bowel sounds are normal.     Palpations: Abdomen is soft.     Tenderness: There is no abdominal tenderness.  Skin:    General: Skin is warm and dry.     Capillary Refill: Capillary refill takes less than 2 seconds.  Neurological:     Mental Status: She is alert.  Psychiatric:        Mood and Affect: Mood normal.      UC Treatments / Results  Labs (all labs ordered are listed, but only abnormal results are displayed) Labs Reviewed - No data to display  EKG   Radiology DG Chest 2 View Result Date: 12/08/2024 EXAM: 2 VIEW(S) XRAY OF THE CHEST 12/08/2024 11:29:01 AM COMPARISON: Chest radiographs 04/10/2015 and earlier. CLINICAL HISTORY: 63 year old female. Cough. FINDINGS: LUNGS AND PLEURA: No focal pulmonary opacity. No pleural effusion. No pneumothorax. HEART AND MEDIASTINUM: No acute abnormality of the cardiac and mediastinal silhouettes. BONES AND SOFT TISSUES: No acute osseous abnormality. IMPRESSION: 1. No acute cardiopulmonary abnormality. Electronically signed by: Helayne Hurst MD 12/08/2024 12:41 PM EST RP Workstation: [id number]    Procedures Procedures (including critical care  time)  Medications Ordered in UC Medications  alum & mag hydroxide-simeth (MAALOX/MYLANTA) 200-200-20 MG/5ML suspension 30 mL (30 mLs Oral Given 12/08/24 1145)  lidocaine  (XYLOCAINE ) 2 % viscous mouth solution 5 mL (5 mLs Mouth/Throat Given 12/08/24 1145)    Initial Impression / Assessment and Plan / UC Course  I have reviewed the triage vital signs and the nursing notes.  Pertinent labs &  imaging results that were available during my care of the patient were reviewed by me and considered in my medical decision making (see chart for details).     Vitals and triage reviewed, patient is hemodynamically stable.  EKG with normal sinus rhythm.  Patient is given GI cocktail in clinic.  Chest x-ray negative.  She remains pain and symptom free while in clinic.  Suspect musculoskeletal component of chest discomfort.  She is given prescription for cyclobenzaprine .  Advised Tylenol, heat and gentle stretches.  Advised if symptoms do not improve in the next 2-3 days with interventions to return to clinic or follow-up with primary care.  Advised go to emergency department for worsening or uncontrolled chest pain, shortness of breath, lightheadedness, feeling faint, nausea, vomiting, or any other concerning symptoms. Final Clinical Impressions(s) / UC Diagnoses   Final diagnoses:  Acute cough     Discharge Instructions      It is unclear what is causing your chest discomfort and your evaluation has shown no signs of medical conditions requiring emergent intervention at this time. This is very reassuring! Your discomfort may be related to pain in the muscles and bones in your chest.   You may use over the counter tylenol as needed for pain.  Apply heat to the chest wall 20 minutes on 20 minutes off and perform gentle stretches to the area. You may take muscle relaxer as prescribed but do not drink alcohol or drive while taking this since it can make you sleepy. Mainly take this at nighttime to help  with symptoms.  If your symptoms do not improve in the next 2-3 days with interventions, please return.. Return to the Emergency Department if you experience worsening or uncontrolled chest pain, shortness of breath, light headedness, feeling faint, nausea, vomiting, or any other concerning symptoms. Otherwise, schedule a follow-up appointment with your PCP for follow-up.       ED Prescriptions     Medication Sig Dispense Auth. Provider   cyclobenzaprine  (FLEXERIL ) 10 MG tablet Take 1 tablet (10 mg total) by mouth 2 (two) times daily as needed for muscle spasms. 6 tablet Richad Ramsay C, FNP      PDMP not reviewed this encounter.    [1]  Social History Tobacco Use   Smoking status: Never   Smokeless tobacco: Never     Lennice Jon BROCKS, FNP 12/08/24 1255  "

## 2024-12-08 NOTE — Discharge Instructions (Addendum)
 It is unclear what is causing your chest discomfort and your evaluation has shown no signs of medical conditions requiring emergent intervention at this time. This is very reassuring! Your discomfort may be related to pain in the muscles and bones in your chest.   You may use over the counter tylenol as needed for pain.  Apply heat to the chest wall 20 minutes on 20 minutes off and perform gentle stretches to the area. You may take muscle relaxer as prescribed but do not drink alcohol or drive while taking this since it can make you sleepy. Mainly take this at nighttime to help with symptoms.  If your symptoms do not improve in the next 2-3 days with interventions, please return.. Return to the Emergency Department if you experience worsening or uncontrolled chest pain, shortness of breath, light headedness, feeling faint, nausea, vomiting, or any other concerning symptoms. Otherwise, schedule a follow-up appointment with your PCP for follow-up.
